# Patient Record
Sex: Male | Born: 1975 | Race: Black or African American | Hispanic: No | Marital: Single | Smoking: Current every day smoker
Health system: Southern US, Community
[De-identification: ages and names within clinical notes are randomized; demographics above are authoritative.]

## PROBLEM LIST (undated history)

## (undated) DIAGNOSIS — I10 Essential (primary) hypertension: Secondary | ICD-10-CM

---

## 1998-09-09 ENCOUNTER — Encounter: Payer: Self-pay | Admitting: Emergency Medicine

## 1998-09-09 ENCOUNTER — Inpatient Hospital Stay (HOSPITAL_COMMUNITY): Admission: EM | Admit: 1998-09-09 | Discharge: 1998-09-10 | Payer: Self-pay | Admitting: Emergency Medicine

## 2000-10-02 ENCOUNTER — Emergency Department (HOSPITAL_COMMUNITY): Admission: EM | Admit: 2000-10-02 | Discharge: 2000-10-02 | Payer: Self-pay | Admitting: Emergency Medicine

## 2001-01-30 ENCOUNTER — Emergency Department (HOSPITAL_COMMUNITY): Admission: EM | Admit: 2001-01-30 | Discharge: 2001-01-30 | Payer: Self-pay | Admitting: Internal Medicine

## 2003-04-03 ENCOUNTER — Emergency Department (HOSPITAL_COMMUNITY): Admission: EM | Admit: 2003-04-03 | Discharge: 2003-04-03 | Payer: Self-pay | Admitting: Emergency Medicine

## 2003-08-24 ENCOUNTER — Emergency Department (HOSPITAL_COMMUNITY): Admission: EM | Admit: 2003-08-24 | Discharge: 2003-08-24 | Payer: Self-pay | Admitting: Emergency Medicine

## 2003-10-22 ENCOUNTER — Encounter: Admission: RE | Admit: 2003-10-22 | Discharge: 2003-10-22 | Payer: Self-pay | Admitting: Internal Medicine

## 2003-11-09 ENCOUNTER — Encounter: Admission: RE | Admit: 2003-11-09 | Discharge: 2003-11-09 | Payer: Self-pay | Admitting: Internal Medicine

## 2004-02-05 ENCOUNTER — Encounter: Admission: RE | Admit: 2004-02-05 | Discharge: 2004-02-05 | Payer: Self-pay | Admitting: Internal Medicine

## 2004-02-26 ENCOUNTER — Encounter: Admission: RE | Admit: 2004-02-26 | Discharge: 2004-02-26 | Payer: Self-pay | Admitting: Internal Medicine

## 2004-07-30 ENCOUNTER — Emergency Department (HOSPITAL_COMMUNITY): Admission: EM | Admit: 2004-07-30 | Discharge: 2004-07-30 | Payer: Self-pay | Admitting: Emergency Medicine

## 2007-02-26 ENCOUNTER — Emergency Department (HOSPITAL_COMMUNITY): Admission: EM | Admit: 2007-02-26 | Discharge: 2007-02-26 | Payer: Self-pay | Admitting: Emergency Medicine

## 2007-03-03 ENCOUNTER — Emergency Department (HOSPITAL_COMMUNITY): Admission: EM | Admit: 2007-03-03 | Discharge: 2007-03-03 | Payer: Self-pay | Admitting: Emergency Medicine

## 2007-12-07 ENCOUNTER — Emergency Department (HOSPITAL_COMMUNITY): Admission: EM | Admit: 2007-12-07 | Discharge: 2007-12-07 | Payer: Self-pay | Admitting: Emergency Medicine

## 2007-12-21 ENCOUNTER — Emergency Department (HOSPITAL_COMMUNITY): Admission: EM | Admit: 2007-12-21 | Discharge: 2007-12-21 | Payer: Self-pay | Admitting: Family Medicine

## 2008-03-12 ENCOUNTER — Emergency Department (HOSPITAL_COMMUNITY): Admission: EM | Admit: 2008-03-12 | Discharge: 2008-03-12 | Payer: Self-pay | Admitting: Emergency Medicine
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
## 2012-08-04 ENCOUNTER — Emergency Department (HOSPITAL_COMMUNITY)
Admission: EM | Admit: 2012-08-04 | Discharge: 2012-08-04 | Disposition: A | Discharge status: Home / Self Care | Payer: Self-pay | Attending: Emergency Medicine | Admitting: Emergency Medicine

## 2012-08-04 ENCOUNTER — Encounter (HOSPITAL_COMMUNITY): Payer: Self-pay | Admitting: Emergency Medicine

## 2012-08-04 DIAGNOSIS — Z0489 Encounter for examination and observation for other specified reasons: Secondary | ICD-10-CM | POA: Insufficient documentation

## 2012-08-04 HISTORY — DX: Essential (primary) hypertension: I10

## 2012-08-04 LAB — HIV ANTIBODY (ROUTINE TESTING W REFLEX): HIV: NONREACTIVE
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
## 2012-08-04 NOTE — ED Notes (Signed)
Patient has been discharged after lab draws.

## 2012-08-05 LAB — HEPATITIS PANEL, ACUTE: Hepatitis B Surface Ag: NEGATIVE

## 2012-10-13 ENCOUNTER — Emergency Department (HOSPITAL_COMMUNITY)
Admission: EM | Admit: 2012-10-13 | Discharge: 2012-10-13 | Disposition: A | Discharge status: Home / Self Care | Payer: Self-pay | Attending: Emergency Medicine | Admitting: Emergency Medicine

## 2012-10-13 ENCOUNTER — Encounter (HOSPITAL_COMMUNITY): Payer: Self-pay | Admitting: Emergency Medicine

## 2012-10-13 DIAGNOSIS — F17200 Nicotine dependence, unspecified, uncomplicated: Secondary | ICD-10-CM | POA: Insufficient documentation

## 2012-10-13 DIAGNOSIS — R110 Nausea: Secondary | ICD-10-CM | POA: Insufficient documentation

## 2012-10-13 DIAGNOSIS — I10 Essential (primary) hypertension: Secondary | ICD-10-CM | POA: Insufficient documentation

## 2012-10-13 DIAGNOSIS — R197 Diarrhea, unspecified: Secondary | ICD-10-CM | POA: Insufficient documentation

## 2012-10-13 MED ORDER — ONDANSETRON 4 MG PO TBDP
4.0000 mg | ORAL_TABLET | Freq: Once | ORAL | Status: AC
  Administered 2012-10-13: 4 mg via ORAL
  Filled 2012-10-13: qty 1

## 2012-10-13 MED ORDER — LOPERAMIDE HCL 2 MG PO CAPS: 2.0000 mg | ORAL_CAPSULE | Freq: Four times a day (QID) | ORAL | Status: DC | PRN

## 2012-10-13 MED ORDER — HYDROCHLOROTHIAZIDE 12.5 MG PO TABS: 12.5000 mg | ORAL_TABLET | Freq: Every day | ORAL | Status: DC

## 2012-10-13 MED ORDER — LOPERAMIDE HCL 2 MG PO CAPS
4.0000 mg | ORAL_CAPSULE | Freq: Once | ORAL | Status: AC
  Administered 2012-10-13: 4 mg via ORAL
  Filled 2012-10-13: qty 2

## 2012-10-13 MED ORDER — GI COCKTAIL ~~LOC~~
30.0000 mL | Freq: Once | ORAL | Status: AC
  Administered 2012-10-13: 30 mL via ORAL
  Filled 2012-10-13: qty 30

## 2012-10-13 MED ORDER — POLYETHYLENE GLYCOL 3350 17 G PO PACK: 17.0000 g | PACK | Freq: Every day | ORAL | Status: DC

## 2012-10-13 NOTE — ED Provider Notes (Signed)
History     CSN: [id number]  Arrival date & time 10/13/12  1308   First MD Initiated Contact with Patient 10/13/12 0701      Chief Complaint  Patient presents with  . Hypertension    (Consider location/radiation/quality/duration/timing/severity/associated sxs/prior treatment) HPI Comments: Patient presents with complaint of high blood pressure. States that he was being treated for HTN while incarcerated and remembers taking norvasc and a "water pill". He presents because he wants to be "checked out:. Patient states that he has also been having nausea and 2 episodes of diarrhea. Denies fever or chills. Denies vomiting or abdominal pain. Denies CP, palpitations, or SOB, or leg swelling.  The history is provided by the patient. No language interpreter was used.    Past Medical History  Diagnosis Date  . Hypertension     History reviewed. No pertinent past surgical history.  No family history on file.  History  Substance Use Topics  . Smoking status: Current Every Day Smoker -- 0.5 packs/day    Types: Cigarettes  . Smokeless tobacco: Not on file  . Alcohol Use: Yes      Review of Systems  Constitutional: Negative for fever and chills.  Respiratory: Negative for shortness of breath.   Cardiovascular: Negative for chest pain, palpitations and leg swelling.  Gastrointestinal: Positive for nausea and diarrhea. Negative for vomiting and abdominal pain.    Allergies  Review of patient's allergies indicates no known allergies.  Home Medications  No current outpatient prescriptions on file.  BP 162/99  Pulse 87  Temp 97.9 F (36.6 C) (Oral)  Resp 17  SpO2 93%  Physical Exam  Nursing note and vitals reviewed. Constitutional: He appears well-developed and well-nourished.  HENT:  Head: Normocephalic and atraumatic.  Mouth/Throat: Oropharynx is clear and moist.  Eyes: Conjunctivae normal and EOM are normal. No scleral icterus.  Neck: Normal range of motion. Neck  supple.  Cardiovascular: Normal rate, regular rhythm, normal heart sounds and intact distal pulses.        No pedal edema appreciated.  Pulmonary/Chest: Effort normal and breath sounds normal.  Abdominal: Soft. Bowel sounds are normal. There is no tenderness.  Neurological: He is alert.  Skin: Skin is warm and dry.    ED Course  Procedures (including critical care time)  Labs Reviewed - No data to display No results found.   1. Hypertension       MDM  Patient presented with concerns for HTN. Patient also complained of gastroenteritis symptoms. Given GI cocktail and loperamide with improvement. Discharged with Rx for prior dosage of HCTZ and phone number for primary care follow-up.  Return precautions given. No red flags for hypertensive urgency/emergency.        Pixie Casino, PA-C 10/13/12 [phone]

## 2012-10-13 NOTE — ED Notes (Signed)
PT. REPORTS HYPERTENSION , DOES NOT TAKE MEDICATIONS , STATES " FELT STRANGE / CAN'T SLEEP "  LAST NIGHT , " JUST WANT TO BE CHECK".

## 2012-10-13 NOTE — ED Notes (Signed)
NAD noted at time of d/c home 

## 2012-10-14 NOTE — ED Provider Notes (Signed)
Medical screening examination/treatment/procedure(s) were performed by non-physician practitioner and as supervising physician I was immediately available for consultation/collaboration.  Marwan T Powers, MD 10/14/12 1648 

## 2012-11-02 ENCOUNTER — Encounter (HOSPITAL_COMMUNITY): Payer: Self-pay

## 2012-11-02 ENCOUNTER — Emergency Department (HOSPITAL_COMMUNITY)
Admission: EM | Admit: 2012-11-02 | Discharge: 2012-11-02 | Disposition: A | Discharge status: Home / Self Care | Payer: Self-pay | Source: Home / Self Care

## 2012-11-02 DIAGNOSIS — F438 Other reactions to severe stress: Secondary | ICD-10-CM

## 2012-11-02 DIAGNOSIS — F17200 Nicotine dependence, unspecified, uncomplicated: Secondary | ICD-10-CM

## 2012-11-02 DIAGNOSIS — F430 Acute stress reaction: Secondary | ICD-10-CM

## 2012-11-02 DIAGNOSIS — Z720 Tobacco use: Secondary | ICD-10-CM

## 2012-11-02 DIAGNOSIS — I10 Essential (primary) hypertension: Secondary | ICD-10-CM

## 2012-11-02 MED ORDER — HYDROCHLOROTHIAZIDE 12.5 MG PO TABS: 12.5000 mg | ORAL_TABLET | Freq: Every day | ORAL | Status: DC

## 2012-11-02 MED ORDER — ALPRAZOLAM 0.5 MG PO TABS: 0.5000 mg | ORAL_TABLET | Freq: Three times a day (TID) | ORAL | Status: DC | PRN

## 2012-11-02 NOTE — ED Provider Notes (Signed)
History     CSN: [id number]  Arrival date & time 11/02/12  1218   None     Chief Complaint  Patient presents with  . Follow-up    (Consider location/radiation/quality/duration/timing/severity/associated sxs/prior treatment) HPI 36 year old male who was recently seen in the clinic 2 weeks ago for uncontrolled hypertension and diarrhea presents with symptoms of increased anxiety and stress. He informs that after his son's accidental death 2 years ago he has been increasingly stressed out and anxious. He also has been very irritable. He denies any headache or blurry vision, dizziness, chest pain, palpitations, shortness of breath, abdominal pain, nausea, vomiting, bowel or urinary symptoms. Denies any weakness. He denies taking his blood pressure medications and she feels very expensive cannot afford it. Denies any suicidal or homicidal ideations.  Past Medical History  Diagnosis Date  . Hypertension     History reviewed. No pertinent past surgical history.  No family history on file.  History  Substance Use Topics  . Smoking status: Current Every Day Smoker -- 0.5 packs/day    Types: Cigarettes  . Smokeless tobacco: Not on file  . Alcohol Use: Yes      Review of Systems As outlined in history of present illness otherwise 10 point review of system unremarkable. Allergies  Review of patient's allergies indicates no known allergies.  Home Medications   Current Outpatient Rx  Name  Route  Sig  Dispense  Refill  . HYDROCHLOROTHIAZIDE 12.5 MG PO TABS   Oral   Take 1 tablet (12.5 mg total) by mouth daily.   30 tablet   1   . LOPERAMIDE HCL 2 MG PO CAPS   Oral   Take 1 capsule (2 mg total) by mouth 4 (four) times daily as needed for diarrhea or loose stools.   30 capsule   0   . POLYETHYLENE GLYCOL 3350 PO PACK   Oral   Take 17 g by mouth daily.   14 each   0     BP 146/90  Pulse 70  Temp 98.5 F (36.9 C) (Oral)  Resp 19  SpO2 100%  Physical  Exam Middle aged male in no acute distress HEENT: No pallor, moist oral mucosa Chest: Clinical condition bilaterally CVS: Normal S1-S2 Abdomen: Soft, nontender CNS: AAO x3 , no tremor ED Course  Procedures (including critical care time)  Labs Reviewed - No data to display No results found.     Assessment/plan Hypertension Uncontrolled vision has not been taking any medications as prescribed. I have counseled him to adhere to his blood pressure medications. Also advised him to go to [facility] for a.4 prescription. Will followup in 2 weeks for monitoring of his blood pressure  Anxiety and increased stress Informs having these symptoms since his son's death 2 years ago and has been persistent . We'll give her prescription for low-dose Xanax for anxiety and stress. Follow up in 2 weeks Tobacco use Patient smokes half to one pack a day and has been strongly counseled on smoking cessation. Does not wish to seeking help at this time  MDM  Follow up in 2 weeks for blood pressure monitoring.        Eddie North, MD 11/02/12 1402

## 2012-11-02 NOTE — ED Notes (Signed)
Follow up recent hospital stay for hypertension

## 2012-12-09 ENCOUNTER — Other Ambulatory Visit (HOSPITAL_COMMUNITY): Payer: Self-pay | Admitting: Internal Medicine

## 2012-12-12 ENCOUNTER — Emergency Department (INDEPENDENT_AMBULATORY_CARE_PROVIDER_SITE_OTHER)
Admission: EM | Admit: 2012-12-12 | Discharge: 2012-12-12 | Disposition: A | Discharge status: Home / Self Care | Payer: Self-pay | Source: Home / Self Care

## 2012-12-12 ENCOUNTER — Encounter (HOSPITAL_COMMUNITY): Payer: Self-pay

## 2012-12-12 DIAGNOSIS — F341 Dysthymic disorder: Secondary | ICD-10-CM

## 2012-12-12 DIAGNOSIS — F419 Anxiety disorder, unspecified: Secondary | ICD-10-CM

## 2012-12-12 DIAGNOSIS — G4700 Insomnia, unspecified: Secondary | ICD-10-CM

## 2012-12-12 DIAGNOSIS — I10 Essential (primary) hypertension: Secondary | ICD-10-CM

## 2012-12-12 MED ORDER — LORAZEPAM 0.5 MG PO TABS: 0.5000 mg | ORAL_TABLET | Freq: Three times a day (TID) | ORAL | Status: DC | PRN

## 2012-12-12 MED ORDER — SERTRALINE HCL 50 MG PO TABS: 50.0000 mg | ORAL_TABLET | Freq: Every day | ORAL | Status: DC

## 2012-12-12 MED ORDER — HYDROCHLOROTHIAZIDE 12.5 MG PO TABS: 12.5000 mg | ORAL_TABLET | Freq: Every day | ORAL | Status: DC

## 2012-12-12 NOTE — ED Provider Notes (Signed)
History     CSN: [id number]  Arrival date & time 12/12/12  1645    Chief Complaint  Patient presents with  . Follow-up   HPI Pt reports that he is still having significant anxiety and depression.  Pt says he lost his son 2 years ago.  The patient has been taking on Xanax which she's been taking for short time.  Patient reports that he does get relief with Xanax.  He still having symptoms.  He's not sleeping well.  He reports that he is still having depression.  He is very irritable.  He reports that he's not suicidal.  He's not having thoughts of hurting others.  He does become agitated at times.  He's currently unemployed and living with family members.  Past Medical History  Diagnosis Date  . Hypertension     History reviewed. No pertinent past surgical history.  No family history on file.  History  Substance Use Topics  . Smoking status: Current Every Day Smoker -- 0.5 packs/day    Types: Cigarettes  . Smokeless tobacco: Not on file  . Alcohol Use: Yes    Review of Systems  Constitutional: Negative.   HENT: Negative.   Genitourinary: Negative.   Neurological: Negative.   Psychiatric/Behavioral: Positive for dysphoric mood and agitation. Negative for hallucinations and confusion.  All other systems reviewed and are negative.    Allergies  Review of patient's allergies indicates no known allergies.  Home Medications   Current Outpatient Rx  Name  Route  Sig  Dispense  Refill  . ALPRAZOLAM 0.5 MG PO TABS   Oral   Take 1 tablet (0.5 mg total) by mouth 3 (three) times daily as needed for anxiety.   30 tablet   0   . HYDROCHLOROTHIAZIDE 12.5 MG PO TABS   Oral   Take 1 tablet (12.5 mg total) by mouth daily.   30 tablet   1   . LOPERAMIDE HCL 2 MG PO CAPS   Oral   Take 1 capsule (2 mg total) by mouth 4 (four) times daily as needed for diarrhea or loose stools.   30 capsule   0   . POLYETHYLENE GLYCOL 3350 PO PACK   Oral   Take 17 g by mouth daily.   14  each   0     BP 145/95  Pulse 67  Temp 98.3 F (36.8 C) (Oral)  Resp 18  SpO2 100%  Physical Exam  Nursing note and vitals reviewed. Constitutional: He is oriented to person, place, and time. He appears well-developed and well-nourished. No distress.  HENT:  Head: Normocephalic and atraumatic.  Eyes: EOM are normal. Pupils are equal, round, and reactive to light.  Neck: Normal range of motion. Neck supple.  Cardiovascular: Normal rate, regular rhythm and normal heart sounds.   Pulmonary/Chest: Effort normal and breath sounds normal.  Abdominal: Soft. Bowel sounds are normal.  Musculoskeletal: Normal range of motion. He exhibits no edema and no tenderness.  Neurological: He is alert and oriented to person, place, and time.  Skin: Skin is warm and dry.  Psychiatric: He has a normal mood and affect. His behavior is normal. Judgment and thought content normal.    ED Course  Procedures (including critical care time)  Labs Reviewed - No data to display No results found.  No diagnosis found.   MDM  IMPRESSION  Anxiety and Depression  Hypertension, uncontrolled   Chronic active nicotine dependence  RECOMMENDATIONS / PLAN Pt contracted for safety.  Trial of zoloft 50 mg po daily.  Lorazepam 0.5 mg po every 4-6 hours prn severe anxiety.   #30 tablets no refills Pt to referred to Behavioral Health  Continue hydrochlorothiazide 12.5 mg by mouth daily.  If no significant improvement then recommend making adjustments to blood pressure regimen. Encouraged tobacco cessation but patient has not open to quitting at this time.  FOLLOW UP 2 weeks   The patient was given clear instructions to go to ER or return to medical center if symptoms don't improve, worsen or new problems develop.  The patient verbalized understanding.  The patient was told to call to get lab results if they haven't heard anything in the next week.            Cleora Fleet, MD 12/12/12 [phone]

## 2012-12-12 NOTE — ED Notes (Signed)
Follow up- hypertension and anxiety

## 2014-05-10 ENCOUNTER — Emergency Department (HOSPITAL_COMMUNITY)
Admission: EM | Admit: 2014-05-10 | Discharge: 2014-05-10 | Disposition: A | Discharge status: Home / Self Care | Payer: Self-pay | Source: Home / Self Care

## 2014-05-11 ENCOUNTER — Emergency Department (INDEPENDENT_AMBULATORY_CARE_PROVIDER_SITE_OTHER)
Admission: EM | Admit: 2014-05-11 | Discharge: 2014-05-11 | Disposition: A | Discharge status: Home / Self Care | Payer: Self-pay | Source: Home / Self Care | Attending: Family Medicine | Admitting: Family Medicine

## 2014-05-11 ENCOUNTER — Encounter (HOSPITAL_COMMUNITY): Payer: Self-pay | Admitting: Emergency Medicine

## 2014-05-11 DIAGNOSIS — L0201 Cutaneous abscess of face: Secondary | ICD-10-CM

## 2014-05-11 DIAGNOSIS — L03211 Cellulitis of face: Secondary | ICD-10-CM

## 2014-05-11 MED ORDER — SULFAMETHOXAZOLE-TRIMETHOPRIM 800-160 MG PO TABS: 2.0000 | ORAL_TABLET | Freq: Two times a day (BID) | ORAL | Status: DC

## 2014-05-11 MED ORDER — DICLOFENAC SODIUM 50 MG PO TBEC: 50.0000 mg | DELAYED_RELEASE_TABLET | Freq: Two times a day (BID) | ORAL | Status: DC | PRN

## 2014-05-11 NOTE — ED Notes (Signed)
Reports abscess to left side of face x 2 days.  Mild drainage last night.  Pain and swelling.  Mild swelling around neck.  Denies any trouble swallowing or sob.

## 2014-05-11 NOTE — ED Provider Notes (Signed)
Steve Jordan is a 38 y.o. male who presents to Urgent Care today for left facial abscess present for 3 days. Patient was able to express a small amount of pus yesterday. He notes tenderness and swelling. He denies any fevers or chills. He has not tried any medications. He feels well otherwise. No nausea vomiting or diarrhea. He does not currently take any medication.   Past Medical History  Diagnosis Date  . Hypertension    History  Substance Use Topics  . Smoking status: Current Every Day Smoker -- 0.50 packs/day    Types: Cigarettes  . Smokeless tobacco: Not on file  . Alcohol Use: Yes   ROS as above Medications: No current facility-administered medications for this encounter.   Current Outpatient Prescriptions  Medication Sig Dispense Refill  . diclofenac (VOLTAREN) 50 MG EC tablet Take 1 tablet (50 mg total) by mouth 2 (two) times daily as needed.  30 tablet  0  . sulfamethoxazole-trimethoprim (SEPTRA DS) 800-160 MG per tablet Take 2 tablets by mouth 2 (two) times daily.  28 tablet  0    Exam:  BP 160/83  Pulse 81  Temp(Src) 99 F (37.2 C) (Oral)  Resp 18  SpO2 99% Gen: Well NAD HEENT: EOMI,  MMM normal posterior pharynx. No lymphadenopathy in the anterior cervical chain Lungs: Normal work of breathing. CTABL Heart: RRR no MRG Abd: NABS, Soft. NT, ND Exts: Brisk capillary refill, warm and well perfused.  Skin: Left skin overlying the mandible. Quarter size area of induration erythema and tenderness with central fluctuance. Small amount of pus expressible. No extending erythema or induration.  Abscess incision and drainage:  Consent obtained and timeout. Skin cleaned with alcohol cold spray applied and 1/2 mL of lidocaine with epinephrine were injected into the skin overlying the area of fluctuance.  Skin was again cleaned with alcohol and 11 blade scalpel was used to cut to the area of fluctuance. Pus was drained and the incision was widened and turned into a  triangle. Blunt dissection was used to break up loculations and further amount of pus was expressed. Wound packed with 1 inch of 1/4  inch packing material. Patient tolerated the procedure well  No results found for this or any previous visit (from the past 24 hour(s)). No results found.  Assessment and Plan: 38 y.o. male with left facial abscess status post incision and drainage. Will additionally treat with Bactrim and diclofenac. Followup as needed  Discussed warning signs or symptoms. Please see discharge instructions. Patient expresses understanding.    Rodolph BongEvan S Corey, MD 05/11/14 [phone]

## 2014-05-11 NOTE — Discharge Instructions (Signed)
Thank you for coming in today. Remove the packing in 2-3 days. It is ok if it falls out sooner.  Complete the antibiotics.   Abscess Care After An abscess (also called a boil or furuncle) is an infected area that contains a collection of pus. Signs and symptoms of an abscess include pain, tenderness, redness, or hardness, or you may feel a moveable soft area under your skin. An abscess can occur anywhere in the body. The infection may spread to surrounding tissues causing cellulitis. A cut (incision) by the surgeon was made over your abscess and the pus was drained out. Gauze may have been packed into the space to provide a drain that will allow the cavity to heal from the inside outwards. The boil may be painful for 5 to 7 days. Most people with a boil do not have high fevers. Your abscess, if seen early, may not have localized, and may not have been lanced. If not, another appointment may be required for this if it does not get better on its own or with medications. HOME CARE INSTRUCTIONS   Only take over-the-counter or prescription medicines for pain, discomfort, or fever as directed by your caregiver.  When you bathe, soak and then remove gauze or iodoform packs at least daily or as directed by your caregiver. You may then wash the wound gently with mild soapy water. Repack with gauze or do as your caregiver directs. SEEK IMMEDIATE MEDICAL CARE IF:   You develop increased pain, swelling, redness, drainage, or bleeding in the wound site.  You develop signs of generalized infection including muscle aches, chills, fever, or a general ill feeling.  An oral temperature above 102 F (38.9 C) develops, not controlled by medication. See your caregiver for a recheck if you develop any of the symptoms described above. If medications (antibiotics) were prescribed, take them as directed. Document Released: 05/28/2005 Document Revised: 02/01/2012 Document Reviewed: 01/23/2008 [facility] Patient  Information 2015 [place], [place]. This information is not intended to replace advice given to you by your health care provider. Make sure you discuss any questions you have with your health care provider.

## 2020-04-07 ENCOUNTER — Ambulatory Visit (HOSPITAL_COMMUNITY)
Admission: EM | Admit: 2020-04-07 | Discharge: 2020-04-07 | Disposition: A | Discharge status: Home / Self Care | Payer: BC Managed Care – PPO | Attending: Family Medicine | Admitting: Family Medicine

## 2020-04-07 ENCOUNTER — Encounter (HOSPITAL_COMMUNITY): Payer: Self-pay

## 2020-04-07 ENCOUNTER — Other Ambulatory Visit: Payer: Self-pay

## 2020-04-07 DIAGNOSIS — L0201 Cutaneous abscess of face: Secondary | ICD-10-CM | POA: Diagnosis not present

## 2020-04-07 MED ORDER — IBUPROFEN 800 MG PO TABS: 800.0000 mg | ORAL_TABLET | Freq: Three times a day (TID) | ORAL | 0 refills | Status: DC | PRN

## 2020-04-07 MED ORDER — DOXYCYCLINE HYCLATE 100 MG PO CAPS: 100.0000 mg | ORAL_CAPSULE | Freq: Two times a day (BID) | ORAL | 0 refills | Status: DC

## 2020-04-07 NOTE — Discharge Instructions (Addendum)
Apply warm compresses 3-4 x daily for 10-15 minutes.  You may then wash site daily with warm water and mild soap  Keep covered to avoid friction  Take antibiotic as prescribed and to completion  Return sooner or go to the ED if you have any new or worsening symptoms such as increased redness, swelling, pain, nausea, vomiting, fever, chills.

## 2020-04-07 NOTE — ED Provider Notes (Signed)
[facility]   [id number] 04/07/20 Arrival Time: 1359  CC: RASH  SUBJECTIVE:  Steve Jordan is a 43 y.o. male who presents with a skin complaint that began on 03/31/20.  Denies precipitating event or trauma.  Denies changes in soaps, detergents, close contacts with similar rash, known trigger or environmental trigger, allergy. Denies medications change or starting a new medication recently. Localizes the rash to L cheek.  Describes it as painful and spreading. He has tried opening the wound on his own with little success. Denies similar symptoms in the past that improved with I & D.   Denies fever, chills, nausea, vomiting, erythema, swelling, discharge, oral lesions, SOB, chest pain, abdominal pain, changes in bowel or bladder function.    ROS: As per HPI.  All other pertinent ROS negative.     Past Medical History:  Diagnosis Date  . Hypertension    History reviewed. No pertinent surgical history. No Known Allergies No current facility-administered medications on file prior to encounter.   Current Outpatient Medications on File Prior to Encounter  Medication Sig Dispense Refill  . diclofenac (VOLTAREN) 50 MG EC tablet Take 1 tablet (50 mg total) by mouth 2 (two) times daily as needed. 30 tablet 0  . sulfamethoxazole-trimethoprim (SEPTRA DS) 800-160 MG per tablet Take 2 tablets by mouth 2 (two) times daily. 28 tablet 0   Social History   Socioeconomic History  . Marital status: Single    Spouse name: Not on file  . Number of children: Not on file  . Years of education: Not on file  . Highest education level: Not on file  Occupational History  . Not on file  Tobacco Use  . Smoking status: Current Every Day Smoker    Packs/day: 0.50    Types: Cigarettes  Substance and Sexual Activity  . Alcohol use: Yes  . Drug use: Yes    Types: Marijuana  . Sexual activity: Not on file  Other Topics Concern  . Not on file  Social History Narrative  . Not on file   Social  Determinants of Health   Financial Resource Strain:   . Difficulty of Paying Living Expenses:   Food Insecurity:   . Worried About Programme researcher, broadcasting/film/video in the Last Year:   . Barista in the Last Year:   Transportation Needs:   . Freight forwarder (Medical):   Marland Kitchen Lack of Transportation (Non-Medical):   Physical Activity:   . Days of Exercise per Week:   . Minutes of Exercise per Session:   Stress:   . Feeling of Stress :   Social Connections:   . Frequency of Communication with Friends and Family:   . Frequency of Social Gatherings with Friends and Family:   . Attends Religious Services:   . Active Member of Clubs or Organizations:   . Attends Banker Meetings:   Marland Kitchen Marital Status:   Intimate Partner Violence:   . Fear of Current or Ex-Partner:   . Emotionally Abused:   Marland Kitchen Physically Abused:   . Sexually Abused:    History reviewed. No pertinent family history.  OBJECTIVE: Vitals:   04/07/20 1427  BP: (!) 155/86  Pulse: 73  Resp: 18  Temp: 98.3 F (36.8 C)  TempSrc: Oral  SpO2: 100%    General appearance: alert; no distress Head: NCAT Lungs: clear to auscultation bilaterally Heart: regular rate and rhythm.  Radial pulse 2+ bilaterally Extremities: no edema Skin: warm and dry; area  of swelling and TTP about 2 x 2 cm in diameter to L cheek just beside the mouth. No swelling or erythema noted to orbits, orbits are nontender, and no edema noted. Psychological: alert and cooperative; normal mood and affect  ASSESSMENT & PLAN:  1. Cutaneous abscess of face     Meds ordered this encounter  Medications  . ibuprofen (ADVIL) 800 MG tablet    Sig: Take 1 tablet (800 mg total) by mouth every 8 (eight) hours as needed for moderate pain.    Dispense:  21 tablet    Refill:  0    Order Specific Question:   Supervising Provider    Answer:   Chase Picket [id number]  . doxycycline (VIBRAMYCIN) 100 MG capsule    Sig: Take 1 capsule (100 mg total) by  mouth 2 (two) times daily.    Dispense:  14 capsule    Refill:  0    Order Specific Question:   Supervising Provider    Answer:   Chase Picket [id number]     Prescribed Doxycycline  Prescribed ibuprofen Apply warm compresses to the area. Take as prescribed and to completion Moisturize skin daily  Follow up with PCP if symptoms persists Return or go to the ER if you have any new or worsening symptoms such as fever, chills, nausea, vomiting, redness, swelling, discharge, if symptoms do not improve with medications. Reviewed expectations re: course of current medical issues. Questions answered. Outlined signs and symptoms indicating need for more acute intervention. Patient verbalized understanding. After Visit Summary given.   Faustino Congress, NP 04/07/20 1459

## 2020-04-07 NOTE — ED Triage Notes (Signed)
Pt present an ingrown hair/Abscess on his jaw. Symptoms started 3 days ago. He would like to get the abscess drained if he can.

## 2020-04-08 ENCOUNTER — Telehealth (HOSPITAL_COMMUNITY): Payer: Self-pay | Admitting: Urgent Care

## 2020-04-08 NOTE — Telephone Encounter (Signed)
Patient called requesting note for work.  He was seen yesterday for facial abscess by NP Ashley Royalty.  Will provide patient with a work note to return to work 04/11/2020.

## 2020-04-26 ENCOUNTER — Other Ambulatory Visit: Payer: Self-pay

## 2020-04-26 ENCOUNTER — Ambulatory Visit (INDEPENDENT_AMBULATORY_CARE_PROVIDER_SITE_OTHER): Payer: BC Managed Care – PPO | Admitting: Licensed Clinical Social Worker

## 2020-04-26 DIAGNOSIS — F332 Major depressive disorder, recurrent severe without psychotic features: Secondary | ICD-10-CM | POA: Diagnosis not present

## 2020-04-29 ENCOUNTER — Telehealth (INDEPENDENT_AMBULATORY_CARE_PROVIDER_SITE_OTHER): Payer: BC Managed Care – PPO | Admitting: Psychiatry

## 2020-04-29 ENCOUNTER — Other Ambulatory Visit: Payer: Self-pay

## 2020-04-29 ENCOUNTER — Encounter (HOSPITAL_COMMUNITY): Payer: Self-pay | Admitting: Psychiatry

## 2020-04-29 DIAGNOSIS — F332 Major depressive disorder, recurrent severe without psychotic features: Secondary | ICD-10-CM

## 2020-04-29 MED ORDER — PRAZOSIN HCL 1 MG PO CAPS: 1.0000 mg | ORAL_CAPSULE | Freq: Every day | ORAL | 1 refills | Status: DC

## 2020-04-29 MED ORDER — UNISOM SLEEPTABS 25 MG PO TABS: 25.0000 mg | ORAL_TABLET | Freq: Every evening | ORAL | 0 refills | Status: DC | PRN

## 2020-04-29 MED ORDER — OLANZAPINE 5 MG PO TABS: 5.0000 mg | ORAL_TABLET | Freq: Every day | ORAL | 0 refills | Status: AC

## 2020-04-29 MED ORDER — CITALOPRAM HYDROBROMIDE 20 MG PO TABS: 20.0000 mg | ORAL_TABLET | Freq: Every day | ORAL | 1 refills | Status: DC

## 2020-04-29 NOTE — Progress Notes (Signed)
Psychiatric Initial Adult Assessment Virtual Visit via Video Note  I connected with Steve Jordan on 04/29/20 at  1:00 PM EDT by a video enabled telemedicine application and verified that I am speaking with the correct person using two identifiers.  Location: Patient: Home Provider: Clinic   I discussed the limitations of evaluation and management by telemedicine and the availability of in person appointments. The patient expressed understanding and agreed to proceed.  I provided 45 minutes of non-face-to-face time during this encounter.     Patient Identification: Steve Jordan MRN:  [id number] Date of Evaluation:  04/29/2020 Referral Source: "I have been depressed and in need of more medications" Chief Complaint:   Visit Diagnosis:    ICD-10-CM   1. Severe episode of recurrent major depressive disorder, without psychotic features (HCC)  F33.2 citalopram (CELEXA) 20 MG tablet    OLANZapine (ZYPREXA) 5 MG tablet    prazosin (MINIPRESS) 1 MG capsule    doxylamine, Sleep, (UNISOM SLEEPTABS) 25 MG tablet    History of Present Illness: 44 year old male seen today for initial psychiatric evaluation.  Patient was referred to outpatient psychiatry by [facility] for medication management.  He has a psychiatric history of anxiety and depression.  He is currently prescribed Zyprexa 5 mg daily, Minipress 1 mg at bedtime, and Celexa 20 mg daily.  He notes that his medications have been effective in the past in managing his depression however notes that he needs refills.  Today he reports that he has been feeling depressed and endorsed insomnia, difficulty concentrating, impaired memory, and weight gain.  Patient requests medications to help him sleep.  Provider offered patient Unisom 25 mg at bedtime.  He endorsed understanding and is agreeable to try medication.  He reports at times he is irritable, impulsive, and have racing thoughts.  He endorses auditory hallucination stating that he hears noises  that he knows are not there.  He denies visual hallucinations.  He reports that his son was electrocuted and passed away in 03/08/10.  He notes that he thinks about his son often and is still saddened by his son's death.    The patient reports that he saw an outpatient counselor today for threapy.  He notes that he was irritable during the visit because he dislikes talking.  He however notes that he will follow-up with therapy and is agreeable to continue medications as prescribed.  No other concerns noted at this time Associated Signs/Symptoms: Depression Symptoms:  depressed mood, insomnia, difficulty concentrating, impaired memory, weight gain, (Hypo) Manic Symptoms:  Flight of Ideas, Hallucinations, Impulsivity, Irritable Mood, Anxiety Symptoms:  Denies Psychotic Symptoms:  Hallucinations: Auditory PTSD Symptoms: Had a traumatic exposure:  Patient notes that his was electrocuted in 2010-03-08 and has recurrent thoughts of his sons.  Past Psychiatric History: Depression and anxiety  Previous Psychotropic Medications: Yes   Substance Abuse History in the last 12 months:  Yes.    Consequences of Substance Abuse: NA Denies  Past Medical History:  Past Medical History:  Diagnosis Date  . Hypertension    History reviewed. No pertinent surgical history.  Family Psychiatric History: Denies family psychiatric history  Family History: History reviewed. No pertinent family history.  Social History:   Social History   Socioeconomic History  . Marital status: Single    Spouse name: Not on file  . Number of children: Not on file  . Years of education: Not on file  . Highest education level: Not on file  Occupational History  . Not on  file  Tobacco Use  . Smoking status: Current Every Day Smoker    Packs/day: 0.50    Types: Cigarettes  Substance and Sexual Activity  . Alcohol use: Yes  . Drug use: Yes    Types: Marijuana  . Sexual activity: Not on file  Other Topics Concern  .  Not on file  Social History Narrative  . Not on file   Social Determinants of Health   Financial Resource Strain:   . Difficulty of Paying Living Expenses:   Food Insecurity:   . Worried About Charity fundraiser in the Last Year:   . Arboriculturist in the Last Year:   Transportation Needs:   . Film/video editor (Medical):   Marland Kitchen Lack of Transportation (Non-Medical):   Physical Activity:   . Days of Exercise per Week:   . Minutes of Exercise per Session:   Stress:   . Feeling of Stress :   Social Connections:   . Frequency of Communication with Friends and Family:   . Frequency of Social Gatherings with Friends and Family:   . Attends Religious Services:   . Active Member of Clubs or Organizations:   . Attends Archivist Meetings:   Marland Kitchen Marital Status:     Additional Social History: Patient reports that he lives in [place].  He notes that he has 1 daughter and a son who is now deceased.  He endorses marijuana use reporting that it helps with his anxiety.  Reports that he drinks alcohol on occasions.  Allergies:  No Known Allergies  Metabolic Disorder Labs: No results found for: HGBA1C, MPG No results found for: PROLACTIN No results found for: CHOL, TRIG, HDL, CHOLHDL, VLDL, LDLCALC No results found for: TSH  Therapeutic Level Labs: No results found for: LITHIUM No results found for: CBMZ No results found for: VALPROATE  Current Medications: Current Outpatient Medications  Medication Sig Dispense Refill  . citalopram (CELEXA) 20 MG tablet Take 1 tablet (20 mg total) by mouth daily. 20 tablet 1  . OLANZapine (ZYPREXA) 5 MG tablet Take 1 tablet (5 mg total) by mouth at bedtime. 30 tablet 0  . prazosin (MINIPRESS) 1 MG capsule Take 1 capsule (1 mg total) by mouth at bedtime. 30 capsule 1  . diclofenac (VOLTAREN) 50 MG EC tablet Take 1 tablet (50 mg total) by mouth 2 (two) times daily as needed. 30 tablet 0  . doxycycline (VIBRAMYCIN) 100 MG capsule Take  1 capsule (100 mg total) by mouth 2 (two) times daily. 14 capsule 0  . doxylamine, Sleep, (UNISOM SLEEPTABS) 25 MG tablet Take 1 tablet (25 mg total) by mouth at bedtime as needed. 30 tablet 0  . ibuprofen (ADVIL) 800 MG tablet Take 1 tablet (800 mg total) by mouth every 8 (eight) hours as needed for moderate pain. 21 tablet 0  . sulfamethoxazole-trimethoprim (SEPTRA DS) 800-160 MG per tablet Take 2 tablets by mouth 2 (two) times daily. 28 tablet 0   No current facility-administered medications for this visit.    Musculoskeletal: Strength & Muscle Tone: Unable to assess due to telehealth visit.  Gait & Station: Unable to assess due to telehealth visit.  Patient leans: Right  Psychiatric Specialty Exam: Review of Systems  There were no vitals taken for this visit.There is no height or weight on file to calculate BMI.  General Appearance: Well Groomed  Eye Contact:  Good  Speech:  Clear and Coherent and Normal Rate  Volume:  Normal  Mood:  Depressed  Affect:  Congruent  Thought Process:  Coherent, Goal Directed and Linear  Orientation:  Full (Time, Place, and Person)  Thought Content:  Hallucinations: Auditory  Suicidal Thoughts:  No  Homicidal Thoughts:  No  Memory:  Immediate;   Good Recent;   Good Remote;   Good  Judgement:  Good  Insight:  Good  Psychomotor Activity:  Normal  Concentration:  Concentration: Good and Attention Span: Good  Recall:  Good  Fund of Knowledge:Good  Language: Good  Akathisia:  No  Handed:  Right  AIMS (if indicated):Not done  Assets:  Communication Skills Desire for Improvement Housing Social Support  ADL's:  Intact  Cognition: WNL  Sleep:  Poor   Screenings:   Assessment and Plan: Patient reports that his medication has been effective in managing his depression.  At this time he does not require medication adjustment however requested medication refills.  Patient notes that he has had difficulty fatigue sleeping.  Patient is agreeable to  starting Unisom 25 mg at bedtime.  He is agreeable to continue all other medications as prescribed 1. Severe episode of recurrent major depressive disorder, without psychotic features (HCC)  Continue- citalopram (CELEXA) 20 MG tablet; Take 1 tablet (20 mg total) by mouth daily.  Dispense: 20 tablet; Refill: 1 Continue- OLANZapine (ZYPREXA) 5 MG tablet; Take 1 tablet (5 mg total) by mouth at bedtime.  Dispense: 30 tablet; Refill: 0 Continue- prazosin (MINIPRESS) 1 MG capsule; Take 1 capsule (1 mg total) by mouth at bedtime.  Dispense: 30 capsule; Refill: 1 Start- doxylamine, Sleep, (UNISOM SLEEPTABS) 25 MG tablet; Take 1 tablet (25 mg total) by mouth at bedtime as needed.  Dispense: 30 tablet; Refill: 0    Shanna Cisco, NP 6/7/20214:52 PM

## 2020-04-29 NOTE — Progress Notes (Signed)
Comprehensive Clinical Assessment (CCA) Note  04/29/2020 Steve Jordan [id number]  Visit Diagnosis:      ICD-10-CM   1. Severe episode of recurrent major depressive disorder, without psychotic features (HCC)  F33.2     CCA Screening, Triage and Referral (STR) Patient Reported Information How did you hear about [place]? Other (Comment) Vesta Mixer)  Referral name: Vesta Mixer  What Is the Reason for Your Visit/Call Today? Establish care for depression  How Long Has This Been Causing You Problems? > than 6 months  What Do You Feel Would Help You the Most Today? Therapy;Medication  Have You Recently Been in Any Inpatient Treatment (Hospital/Detox/Crisis Center/28-Day Program)? No  Have You Recently Had Any Thoughts About Hurting Yourself? No  Are You Planning to Commit Suicide/Harm Yourself At This time? No  Have you Recently Had Thoughts About Hurting Someone Steve Jordan? No  Have You Used Any Alcohol or Drugs in the Past 24 Hours? No  Do You Currently Have a Therapist/Psychiatrist? No  Have You Been Recently Discharged From Any Office Practice or Programs? Yes  Explanation of Discharge From Practice/Program: [facility] transitioning pt care to [facility]   CCA Screening Triage Referral Assessment Type of Contact: Tele-Assessment  Is this Initial or Reassessment? Initial Assessment  Patient Reported Information Reviewed? Yes  Collateral Involvement: None reported  Patient Determined To Be At Risk for Harm To Self or Others Based on Review of Patient Reported Information or Presenting Complaint? No  Location of Assessment: [facility]  Does Patient Present under Involuntary Commitment? No  [place] of Residence: [place]  Patient Currently Receiving the Following Services: Medication Management  Options For Referral: Medication Management;Outpatient Therapy  CCA Biopsychosocial Intake/Chief Complaint:  CCA Intake With Chief Complaint CCA Part Two Date: 04/26/20 CCA Part Two  Time: 0800 Chief Complaint/Presenting Problem: Depression Patient's Currently Reported Symptoms/Problems: Self isolating as much as possible, sleep problems, wt gain, irritable Individual's Strengths: steadily working, emotional support Biomedical engineer Preferences: Pt wishes to avoid discussion of unresolved grief Type of Services Patient Feels Are Needed: Pt accepting of counseling session 1xmon Initial Clinical Notes/Concerns: Pt lost his only son at age 38 in an accidental electrocution. Pt incarcerated at the time but did get to go to funeral service. He has unresolved grief he is resistant to processing.  Mental Health Symptoms Depression:  Depression: Irritability, Sleep (too much or little), Weight gain/loss  Mania:  Mania: None  Anxiety:   Anxiety: None  Psychosis:  Psychosis: None  Trauma:  Trauma: Avoids reminders of event, Detachment from others, Emotional numbing, Irritability/anger  Obsessions:  Obsessions: None  Compulsions:  Compulsions: None  Inattention:  Inattention: None  Hyperactivity/Impulsivity:  Hyperactivity/Impulsivity: N/A  Oppositional/Defiant Behaviors:  Oppositional/Defiant Behaviors: N/A  Emotional Irregularity:  Emotional Irregularity: Chronic feelings of emptiness  Other Mood/Personality Symptoms:      Mental Status Exam Appearance and self-care  Stature:  Stature: Average  Weight:  Weight: Overweight  Clothing:  Clothing: Casual  Grooming:  Grooming: Normal  Cosmetic use:  Cosmetic Use: None  Posture/gait:  Posture/Gait: Normal  Motor activity:  Motor Activity: Not Remarkable  Sensorium  Attention:  Attention: Normal  Concentration:  Concentration: Variable  Orientation:  Orientation: X5  Recall/memory:  Recall/Memory: Normal  Affect and Mood  Affect:  Affect: Depressed, Restricted  Mood:  Mood: Depressed  Relating  Eye contact:  Eye Contact: Avoided  Facial expression:  Facial Expression: Depressed  Attitude toward examiner:  Attitude  Toward Examiner: Guarded  Thought and Language  Speech flow: Speech  Flow: Slow, Soft  Thought content:  Thought Content: Appropriate to Mood and Circumstances  Preoccupation:  Preoccupations: Other (Comment)(Loss of son)  Hallucinations:  Hallucinations: None  Organization:     Transport planner of Knowledge:  Fund of Knowledge: Average  Intelligence:  Intelligence: Average  Abstraction:  Abstraction: Normal  Judgement:  Judgement: Fair  Art therapist:  Reality Testing: Adequate  Insight:  Insight: Fair  Decision Making:  Decision Making: Normal  Social Functioning  Social Maturity:  Social Maturity: Isolates  Social Judgement:  Social Judgement: "Games developer"  Stress  Stressors:  Stressors: Grief/losses  Coping Ability:  Coping Ability: (Limited)  Skill Deficits:  Skill Deficits: (Emotional self care)  Supports:  Supports: Support needed   Religion: Religion/Spirituality Are You A Religious Person?: Yes What is Your Religious Affiliation?: Christian How Might This Affect Treatment?: A strength to draw upon  Leisure/Recreation: Leisure / Recreation Do You Have Hobbies?: Yes Leisure and Hobbies: Insurance underwriter, Riding ATV, Time with dog named Chief Operating Officer who is 76 mon old  Exercise/Diet: Exercise/Diet Do You Exercise?: Yes What Type of Exercise Do You Do?: Other (Comment)(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.) How Many Times a Week Do You Exercise?: 4-5 times a week Have You Gained or Lost A Significant Amount of Weight in the Past Six Months?: Yes-Gained Number of Pounds Gained: (unknown number of lbs, pt states he attributes weight gain to meds and only takes them "here and there".) Do You Follow a Special Diet?: No Do You Have Any Trouble Sleeping?: Yes Explanation of Sleeping Difficulties: inadequate sleep per pt  CCA Employment/Education Employment/Work Situation: Employment / Work Situation Employment situation: Employed Where is  patient currently employed?: [employer] How long has patient been employed?: 9 mon Patient's job has been impacted by current illness: No(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 He states "I do my work and they leave me alone.") Has patient ever been in the [employer]?: No  Education: Education Is Patient Currently Attending School?: No Last Grade Completed: 9 Did You Graduate From [employer]?: No Did You Nutritional therapist?: No Did You Attend Graduate School?: No  CCA Family/Childhood History Family and Relationship History: Family history Marital status: Single What is your sexual orientation?: heterosexual Does patient have children?: Yes How many children?: 2(Son deceased, has a 15 yr old dtr.) How is patient's relationship with their children?: Pt reports his dtr would like to see him more often than he feels he is able d/t self isolation.  Childhood History:  Childhood History By whom was/is the patient raised?: Both parents, Other (Comment)(Parents seperated when pt was 26.) Patient's description of current relationship with people who raised him/her: Distant How were you disciplined when you got in trouble as a child/adolescent?: Spankings, time out Does patient have siblings?: Yes Number of Siblings: 2(one half brother on dad's side and one half sister on mom's side) Description of patient's current relationship with siblings: Distant Did patient suffer any verbal/emotional/physical/sexual abuse as a child?: No Did patient suffer from severe childhood neglect?: No Has patient ever been sexually abused/assaulted/raped as an adolescent or adult?: No Has patient been affected by domestic violence as an adult?: No   CCA Substance Use Alcohol/Drug Use: Alcohol / Drug Use History of alcohol / drug use?: No history of alcohol / drug abuse(Pt reports he is a social drinker, denies drug use)  Steve Jordan

## 2020-05-24 ENCOUNTER — Ambulatory Visit (HOSPITAL_COMMUNITY): Payer: BC Managed Care – PPO | Admitting: Licensed Clinical Social Worker

## 2020-05-24 ENCOUNTER — Other Ambulatory Visit: Payer: Self-pay

## 2020-05-28 ENCOUNTER — Telehealth (HOSPITAL_COMMUNITY): Payer: BC Managed Care – PPO | Admitting: Psychiatry

## 2020-05-28 ENCOUNTER — Other Ambulatory Visit: Payer: Self-pay

## 2020-05-28 ENCOUNTER — Encounter (HOSPITAL_COMMUNITY): Payer: Self-pay | Admitting: Psychiatry

## 2020-06-18 ENCOUNTER — Other Ambulatory Visit: Payer: Self-pay

## 2020-06-18 ENCOUNTER — Ambulatory Visit (INDEPENDENT_AMBULATORY_CARE_PROVIDER_SITE_OTHER): Payer: BC Managed Care – PPO | Admitting: Licensed Clinical Social Worker

## 2020-06-18 DIAGNOSIS — F332 Major depressive disorder, recurrent severe without psychotic features: Secondary | ICD-10-CM

## 2020-06-18 NOTE — Progress Notes (Signed)
   THERAPIST PROGRESS NOTE  Session Time: 30 min  Participation Level: Active  Behavioral Response: CasualDrowsyNegative and Depressed  Type of Therapy: Individual Therapy  Treatment Goals addressed: Communication: Additional assessment and Coping  Interventions: CBT, Supportive and Reframing  Summary: Steve Jordan is a 44 y.o. male who presents with hx of MDD. Today pt signs on with link sent for video session. Pt missed last session. Pt states he has just gotten off work and is sleepy. He is reclined most of session but participatory. Pt states his job at [employer] is "going fine". LCSW assessed for s&s of depression. Pt states everything is about the same, "It's gonna stay the same". He brings up the death of his son and how rare his death was from electrocution. He feels he is a different person since the death and since nothing can bring son back nothing will change for him. He states "I'm doing alright. I really don't like to think about it". He continues to heavily self isolate. LCSW acknowledged pt's thoughts and feelings, provided education on self talk and effects of unmanaged pain/grief. Reframed the fact that the prospect of him improving his quality of life is real if he is willing to participate in the work with a desire for change. Assessed for his willingness to journal and consider letter writing. Pt reports he did some journaling in prison but not since. He states he visits the gravesite at times and talks to his son. LCSW encouraged this and asked him to consider letter writing. Pt noncommittal. LCSW reassessed relationship with his only other child, his dtr and grandson. He reports she sent him a birthday text but they have not talked. He agrees she too could be hurting from the death of her brother. LCSW suggested they might be able to support one another. He shows minimal stated desire to change this dynamic either but appears to be listening to clinician. Shifted to coping  strategies. Pt reports his ATV is in the shop but he hopes to get it today. Assessed for status of dog, Batman. Pt smiles and laughs as he talks about these two things he enjoys. Pt asks med and pharmacy questions. LCSW provided phone # for [place] and [facility]. LCSW facilitated med management appt with schedulers. LCSW provided education on crisis unit at this facility. Reviewed poc with pt's verbal agreement. Pt states appreciation for care and would like to continue video sessions 1xmon.         Suicidal/Homicidal: Nowithout intent/plan  Therapist Response: Pt remains receptive to care.  Plan: Return again in 4 weeks.  Diagnosis: Axis I: Major Depression, Recurrent severe    Axis II: Deferred  [place], LCSW 06/18/2020

## 2020-07-22 ENCOUNTER — Ambulatory Visit (HOSPITAL_COMMUNITY): Payer: BC Managed Care – PPO | Admitting: Licensed Clinical Social Worker

## 2020-07-22 ENCOUNTER — Other Ambulatory Visit: Payer: Self-pay

## 2020-07-22 ENCOUNTER — Telehealth (HOSPITAL_COMMUNITY): Payer: Self-pay | Admitting: Licensed Clinical Social Worker

## 2020-07-22 NOTE — Telephone Encounter (Signed)
LCSW sent text to pt's phone for virtual video session per his preference. Pt signs on and reports he is in the middle of packing to move. He states he would like to reschedule. LCSW agrees. Session will be rescheduled.

## 2020-07-24 ENCOUNTER — Telehealth (HOSPITAL_COMMUNITY): Payer: BC Managed Care – PPO | Admitting: Psychiatry

## 2020-07-24 ENCOUNTER — Other Ambulatory Visit: Payer: Self-pay

## 2020-08-19 ENCOUNTER — Other Ambulatory Visit: Payer: Self-pay

## 2020-08-19 ENCOUNTER — Ambulatory Visit (HOSPITAL_COMMUNITY): Payer: Self-pay | Admitting: Licensed Clinical Social Worker

## 2020-08-19 ENCOUNTER — Telehealth (HOSPITAL_COMMUNITY): Payer: Self-pay | Admitting: Licensed Clinical Social Worker

## 2020-08-19 NOTE — Telephone Encounter (Signed)
LCSW sent link for video session as scheduled. When pt did not sign on for session LCSW phoned pt. Pt answers and states he cannot participate as he is at work. He states he is now working two jobs. LCSW advised pt of next session date and time. Pt states he will be sure to be present.

## 2020-09-16 ENCOUNTER — Ambulatory Visit (HOSPITAL_COMMUNITY): Payer: Self-pay | Admitting: Licensed Clinical Social Worker

## 2020-09-16 ENCOUNTER — Telehealth (HOSPITAL_COMMUNITY): Payer: Self-pay | Admitting: Licensed Clinical Social Worker

## 2020-09-16 ENCOUNTER — Other Ambulatory Visit: Payer: Self-pay

## 2020-09-16 NOTE — Telephone Encounter (Signed)
LCSW sent text with video link for 9a appt. When pt failed to sign on LCSW called pt. The call went to vm. LCSW left vm with details of the purpose of call. Left phone contact for [facility] for pt to call back to learn scheduling options as desired.

## 2020-10-21 ENCOUNTER — Ambulatory Visit (HOSPITAL_COMMUNITY): Payer: Self-pay | Admitting: Licensed Clinical Social Worker

## 2021-05-25 ENCOUNTER — Encounter (HOSPITAL_COMMUNITY): Payer: Self-pay | Admitting: Emergency Medicine

## 2021-05-25 ENCOUNTER — Ambulatory Visit (HOSPITAL_COMMUNITY)
Admission: EM | Admit: 2021-05-25 | Discharge: 2021-05-25 | Disposition: A | Discharge status: Home / Self Care | Payer: BC Managed Care – PPO | Attending: Student | Admitting: Student

## 2021-05-25 DIAGNOSIS — M5412 Radiculopathy, cervical region: Secondary | ICD-10-CM | POA: Diagnosis not present

## 2021-05-25 MED ORDER — PREDNISONE 20 MG PO TABS: 40.0000 mg | ORAL_TABLET | Freq: Every day | ORAL | 0 refills | Status: AC

## 2021-05-25 MED ORDER — TIZANIDINE HCL 2 MG PO TABS: 2.0000 mg | ORAL_TABLET | Freq: Four times a day (QID) | ORAL | 0 refills | Status: DC | PRN

## 2021-05-25 NOTE — ED Triage Notes (Signed)
Pt presents with right arm/ should pain and numbness. States does a lot of lifting and movements at job. States arm goes numb at night.

## 2021-05-25 NOTE — ED Provider Notes (Signed)
[facility]    CSN: [id number] Arrival date & time: 05/25/21  1012      History   Chief Complaint Chief Complaint  Patient presents with   Arm Pain   Shoulder Pain    HPI Steve Jordan is a 45 y.o. male presenting with right arm numbness and tingling x3 days.  Medical history hypertension.  States he does a lot of heavy lifting and repetitive movements at work, but no issues like this in the past. Notes numbness and tingling, worst over upper outer arm. Sensitive to touch over elbow. Denies weakness, shooting pain. Right handed. Denies neck pain, back pain.       HPI  Past Medical History:  Diagnosis Date   Hypertension     Patient Active Problem List   Diagnosis Date Noted   Severe episode of recurrent major depressive disorder, without psychotic features (HCC) 04/29/2020    History reviewed. No pertinent surgical history.     Home Medications    Prior to Admission medications   Medication Sig Start Date End Date Taking? Authorizing Provider  predniSONE (DELTASONE) 20 MG tablet Take 2 tablets (40 mg total) by mouth daily for 5 days. 05/25/21 05/30/21 Yes Rhys Martini, PA-C  tiZANidine (ZANAFLEX) 2 MG tablet Take 1 tablet (2 mg total) by mouth every 6 (six) hours as needed for muscle spasms. 05/25/21  Yes Rhys Martini, PA-C  citalopram (CELEXA) 20 MG tablet Take 1 tablet (20 mg total) by mouth daily. 04/29/20 05/29/20  Shanna Cisco, NP  diclofenac (VOLTAREN) 50 MG EC tablet Take 1 tablet (50 mg total) by mouth 2 (two) times daily as needed. 05/11/14   Rodolph Bong, MD  doxycycline (VIBRAMYCIN) 100 MG capsule Take 1 capsule (100 mg total) by mouth 2 (two) times daily. 04/07/20   Moshe Cipro, NP  doxylamine, Sleep, (UNISOM SLEEPTABS) 25 MG tablet Take 1 tablet (25 mg total) by mouth at bedtime as needed. 04/29/20   Shanna Cisco, NP  ibuprofen (ADVIL) 800 MG tablet Take 1 tablet (800 mg total) by mouth every 8 (eight) hours as needed for  moderate pain. 04/07/20   Moshe Cipro, NP  OLANZapine (ZYPREXA) 5 MG tablet Take 1 tablet (5 mg total) by mouth at bedtime. 04/29/20 05/29/20  Shanna Cisco, NP  prazosin (MINIPRESS) 1 MG capsule Take 1 capsule (1 mg total) by mouth at bedtime. 04/29/20   Shanna Cisco, NP  sulfamethoxazole-trimethoprim (SEPTRA DS) 800-160 MG per tablet Take 2 tablets by mouth 2 (two) times daily. 05/11/14   Rodolph Bong, MD    Family History History reviewed. No pertinent family history.  Social History Social History   Tobacco Use   Smoking status: Every Day    Packs/day: 0.50    Pack years: 0.00    Types: Cigarettes  Substance Use Topics   Alcohol use: Yes   Drug use: Yes    Types: Marijuana     Allergies   Patient has no known allergies.   Review of Systems Review of Systems  Musculoskeletal:        Right arm numbness and tingling  All other systems reviewed and are negative.   Physical Exam Triage Vital Signs ED Triage Vitals  Enc Vitals Group     BP 05/25/21 1102 (!) 161/83     Pulse Rate 05/25/21 1102 80     Resp 05/25/21 1102 18     Temp 05/25/21 1102 98.2 F (36.8 C)  Temp Source 05/25/21 1102 Oral     SpO2 05/25/21 1102 99 %     Weight --      Height --      Head Circumference --      Peak Flow --      Pain Score 05/25/21 1100 6     Pain Loc --      Pain Edu? --      Excl. in GC? --    No data found.  Updated Vital Signs BP (!) 161/83 (BP Location: Right Arm)   Pulse 80   Temp 98.2 F (36.8 C) (Oral)   Resp 18   SpO2 99%   Visual Acuity Right Eye Distance:   Left Eye Distance:   Bilateral Distance:    Right Eye Near:   Left Eye Near:    Bilateral Near:     Physical Exam Vitals reviewed.  Constitutional:      General: He is not in acute distress.    Appearance: Normal appearance. He is not ill-appearing or diaphoretic.  HENT:     Head: Normocephalic and atraumatic.  Cardiovascular:     Rate and Rhythm: Normal rate and regular  rhythm.     Heart sounds: Normal heart sounds.  Pulmonary:     Effort: Pulmonary effort is normal.     Breath sounds: Normal breath sounds.  Musculoskeletal:     Comments: R arm/shoulder without reproducible tenderness. Strength 5/5, sensation intact. Abduction and adduction intact and without pain. Negative empty beer can and neer. Grip strength 5/5, radial pulse 2+, cap refill <2 seconds. Negative spurling. No cervical spinous or paraspinous tenderness. No trapezius tenderness.  Skin:    General: Skin is warm.  Neurological:     General: No focal deficit present.     Mental Status: He is alert and oriented to person, place, and time.  Psychiatric:        Mood and Affect: Mood normal.        Behavior: Behavior normal.        Thought Content: Thought content normal.        Judgment: Judgment normal.     UC Treatments / Results  Labs (all labs ordered are listed, but only abnormal results are displayed) Labs Reviewed - No data to display  EKG   Radiology No results found.  Procedures Procedures (including critical care time)  Medications Ordered in UC Medications - No data to display  Initial Impression / Assessment and Plan / UC Course  I have reviewed the triage vital signs and the nursing notes.  Pertinent labs & imaging results that were available during my care of the patient were reviewed by me and considered in my medical decision making (see chart for details).     This patient is a very pleasant 45 y.o. year old male presenting with cervical radiculitis. No red flag symptoms. Trial of prednisone and zanaflex as below, he is not a diabetic. ED return precautions discussed. Patient verbalizes understanding and agreement.    Final Clinical Impressions(s) / UC Diagnoses   Final diagnoses:  Cervical radiculopathy     Discharge Instructions      -Prednisone, 2 pills taken at the same time for 5 days in a row.  Try taking this earlier in the day as it can  give you energy. Avoid NSAIDs like ibuprofen and alleve while taking this medication as they can increase your risk of stomach upset and even GI bleeding when in combination with a  steroid. You can continue tylenol (acetaminophen) up to 1000mg  3x daily. -Start the muscle relaxer-Zanaflex (tizanidine), up to 3 times daily for muscle spasms and pain.  This can make you drowsy, so take at bedtime or when you do not need to drive or operate machinery. -Heat/ice for additional relief -Limit heavy lifting and overuse while pain persists. Work note provided.     ED Prescriptions     Medication Sig Dispense Auth. Provider   predniSONE (DELTASONE) 20 MG tablet Take 2 tablets (40 mg total) by mouth daily for 5 days. 10 tablet , PA-C   tiZANidine (ZANAFLEX) 2 MG tablet Take 1 tablet (2 mg total) by mouth every 6 (six) hours as needed for muscle spasms. 21 tablet Rhys Martini, PA-C      PDMP not reviewed this encounter.   Rhys Martini, PA-C 05/25/21 1250

## 2021-05-25 NOTE — Discharge Instructions (Addendum)
-  Prednisone, 2 pills taken at the same time for 5 days in a row.  Try taking this earlier in the day as it can give you energy. Avoid NSAIDs like ibuprofen and alleve while taking this medication as they can increase your risk of stomach upset and even GI bleeding when in combination with a steroid. You can continue tylenol (acetaminophen) up to 1000mg  3x daily. -Start the muscle relaxer-Zanaflex (tizanidine), up to 3 times daily for muscle spasms and pain.  This can make you drowsy, so take at bedtime or when you do not need to drive or operate machinery. -Heat/ice for additional relief -Limit heavy lifting and overuse while pain persists. Work note provided.

## 2021-08-14 ENCOUNTER — Encounter (HOSPITAL_COMMUNITY): Payer: Self-pay

## 2021-08-14 ENCOUNTER — Other Ambulatory Visit: Payer: Self-pay

## 2021-08-14 ENCOUNTER — Ambulatory Visit (HOSPITAL_COMMUNITY)
Admission: EM | Admit: 2021-08-14 | Discharge: 2021-08-14 | Disposition: A | Discharge status: Home / Self Care | Payer: 59 | Attending: Internal Medicine | Admitting: Internal Medicine

## 2021-08-14 DIAGNOSIS — G5601 Carpal tunnel syndrome, right upper limb: Secondary | ICD-10-CM | POA: Diagnosis not present

## 2021-08-14 MED ORDER — PREDNISONE 10 MG (21) PO TBPK: ORAL_TABLET | Freq: Every day | ORAL | 0 refills | Status: DC

## 2021-08-14 NOTE — Discharge Instructions (Addendum)
Please use wrist splint during sleep and when you are not at work Take medications as prescribed Gentle range of motion exercises Symptoms persist please go to orthopedic surgery (with information on the discharge papers).

## 2021-08-14 NOTE — ED Triage Notes (Signed)
Pt reports numbness and tingling fingers right hand x 2 months. State sis worse when la down.

## 2021-08-15 NOTE — ED Provider Notes (Signed)
[facility]    CSN: [id number] Arrival date & time: 08/14/21  1610      History   Chief Complaint Chief Complaint  Patient presents with   Tingling   Numbness    HPI Steve Jordan is a 45 y.o. male with no past medical history comes to urgent care with a 1 month history of numbness and tingling in the thumb, index and middle fingers of the right hand.  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  He works 9-hour days-5 days a week.  No wrist pain.  Patient continues to have a good grip.  No rash on the fingers.  No symptoms in the left hand.  Patient was seen a couple of months ago for similar reasons.  Prednisone was prescribed.  Patient had transient improvement in symptoms but has worsened.  HPI  Past Medical History:  Diagnosis Date   Hypertension     Patient Active Problem List   Diagnosis Date Noted   Severe episode of recurrent major depressive disorder, without psychotic features (HCC) 04/29/2020    History reviewed. No pertinent surgical history.     Home Medications    Prior to Admission medications   Medication Sig Start Date End Date Taking? Authorizing Provider  predniSONE (STERAPRED UNI-PAK 21 TAB) 10 MG (21) TBPK tablet Take by mouth daily. Take 6 tabs by mouth daily  for 2 days, then 5 tabs for 2 days, then 4 tabs for 2 days, then 3 tabs for 2 days, 2 tabs for 2 days, then 1 tab by mouth daily for 2 days 08/14/21  Yes Murvin Gift, Britta Mccreedy, MD  OLANZapine (ZYPREXA) 5 MG tablet Take 1 tablet (5 mg total) by mouth at bedtime. 04/29/20 05/29/20  Shanna Cisco, NP    Family History History reviewed. No pertinent family history.  Social History Social History   Tobacco Use   Smoking status: Every Day    Packs/day: 0.50    Types: Cigarettes   Smokeless tobacco: Never  Substance Use Topics   Alcohol use: Yes   Drug use: Never     Allergies   Patient has no known  allergies.   Review of Systems Review of Systems  Musculoskeletal:  Positive for arthralgias. Negative for joint swelling, myalgias, neck pain and neck stiffness.  Neurological:  Negative for headaches.    Physical Exam Triage Vital Signs ED Triage Vitals  Enc Vitals Group     BP 08/14/21 1728 (!) 146/95     Pulse Rate 08/14/21 1728 80     Resp 08/14/21 1728 18     Temp 08/14/21 1728 98.1 F (36.7 C)     Temp Source 08/14/21 1728 Oral     SpO2 08/14/21 1728 97 %     Weight --      Height --      Head Circumference --      Peak Flow --      Pain Score 08/14/21 1726 10     Pain Loc --      Pain Edu? --      Excl. in GC? --    No data found.  Updated Vital Signs BP (!) 146/95 (BP Location: Left Arm) Comment: Pt has not taken his meds iin 1 year, as he is looking for a PCP.  Pulse 80   Temp 98.1 F (36.7 C) (Oral)   Resp 18   SpO2 97%   Visual  Acuity Right Eye Distance:   Left Eye Distance:   Bilateral Distance:    Right Eye Near:   Left Eye Near:    Bilateral Near:     Physical Exam Constitutional:      Appearance: Normal appearance.  Cardiovascular:     Rate and Rhythm: Normal rate and regular rhythm.  Musculoskeletal:        General: No swelling, tenderness or signs of injury. Normal range of motion.  Skin:    Capillary Refill: Capillary refill takes less than 2 seconds.  Neurological:     General: No focal deficit present.     Mental Status: He is alert and oriented to person, place, and time.     Sensory: No sensory deficit.     Motor: No weakness.     Coordination: Coordination normal.     UC Treatments / Results  Labs (all labs ordered are listed, but only abnormal results are displayed) Labs Reviewed - No data to display  EKG   Radiology No results found.  Procedures Procedures (including critical care time)  Medications Ordered in UC Medications - No data to display  Initial Impression / Assessment and Plan / UC Course  I have  reviewed the triage vital signs and the nursing notes.  Pertinent labs & imaging results that were available during my care of the patient were reviewed by me and considered in my medical decision making (see chart for details).     Carpal Tunnel Syndrome: Tapering dose prednisone Wrist brace Gentle range of motion Return to urgent care if symptoms persist If symptoms persist you may benefit from orthopedic surgery evaluation. Final Clinical Impressions(s) / UC Diagnoses   Final diagnoses:  Carpal tunnel syndrome of right wrist     Discharge Instructions      Please use wrist splint during sleep and when you are not at work Take medications as prescribed Gentle range of motion exercises Symptoms persist please go to orthopedic surgery (with information on the discharge papers).   ED Prescriptions     Medication Sig Dispense Auth. Provider   predniSONE (STERAPRED UNI-PAK 21 TAB) 10 MG (21) TBPK tablet Take by mouth daily. Take 6 tabs by mouth daily  for 2 days, then 5 tabs for 2 days, then 4 tabs for 2 days, then 3 tabs for 2 days, 2 tabs for 2 days, then 1 tab by mouth daily for 2 days 42 tablet Keshanna Riso, Britta Mccreedy, MD      PDMP not reviewed this encounter.   Merrilee Jansky, MD 08/15/21 [phone]

## 2022-08-27 ENCOUNTER — Ambulatory Visit (INDEPENDENT_AMBULATORY_CARE_PROVIDER_SITE_OTHER): Payer: 59 | Admitting: Behavioral Health

## 2022-08-27 DIAGNOSIS — F4329 Adjustment disorder with other symptoms: Secondary | ICD-10-CM | POA: Diagnosis not present

## 2022-08-27 DIAGNOSIS — F418 Other specified anxiety disorders: Secondary | ICD-10-CM | POA: Diagnosis not present

## 2022-08-27 NOTE — Progress Notes (Signed)
                Steve Jordan L Tyleah Loh, LMFT 
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
## 2022-09-17 ENCOUNTER — Ambulatory Visit: Payer: 59 | Admitting: Behavioral Health

## 2022-09-17 NOTE — Progress Notes (Unsigned)
                Fate Galanti L Fey Coghill, LMFT 

## 2022-09-25 ENCOUNTER — Emergency Department (HOSPITAL_COMMUNITY)
Admission: EM | Admit: 2022-09-25 | Discharge: 2022-09-25 | Disposition: A | Discharge status: Home / Self Care | Payer: 59 | Attending: Emergency Medicine | Admitting: Emergency Medicine

## 2022-09-25 ENCOUNTER — Other Ambulatory Visit: Payer: Self-pay

## 2022-09-25 ENCOUNTER — Encounter (HOSPITAL_COMMUNITY): Payer: Self-pay

## 2022-09-25 DIAGNOSIS — M5431 Sciatica, right side: Secondary | ICD-10-CM | POA: Diagnosis not present

## 2022-09-25 DIAGNOSIS — R030 Elevated blood-pressure reading, without diagnosis of hypertension: Secondary | ICD-10-CM

## 2022-09-25 DIAGNOSIS — R200 Anesthesia of skin: Secondary | ICD-10-CM | POA: Diagnosis present

## 2022-09-25 DIAGNOSIS — M79604 Pain in right leg: Secondary | ICD-10-CM

## 2022-09-25 LAB — CBG MONITORING, ED: Glucose-Capillary: 104 mg/dL — ABNORMAL HIGH (ref 70–99)

## 2022-09-25 MED ORDER — PREDNISONE 10 MG PO TABS: 40.0000 mg | ORAL_TABLET | Freq: Every day | ORAL | 0 refills | Status: AC
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
## 2022-10-07 ENCOUNTER — Ambulatory Visit (INDEPENDENT_AMBULATORY_CARE_PROVIDER_SITE_OTHER): Payer: 59 | Admitting: Behavioral Health

## 2022-10-07 DIAGNOSIS — F4329 Adjustment disorder with other symptoms: Secondary | ICD-10-CM

## 2022-10-07 DIAGNOSIS — F418 Other specified anxiety disorders: Secondary | ICD-10-CM | POA: Diagnosis not present

## 2022-10-07 NOTE — Progress Notes (Signed)
                Uthman Mroczkowski L Miho Monda, LMFT 

## 2022-10-07 NOTE — Progress Notes (Addendum)
[facility] Counselor/Therapist Progress Note  Patient ID: Steve Jordan, MRN: [id number],    Date: 10/07/2022  Time Spent: 30 min Caregility video; Pt is home in private & Provider @ [facility] Office  Treatment Type: Individual Therapy  Reported Symptoms: Elevated anx/dep due to Disability Hearing on Tue he is unsure went well.   Mental Status Exam: Appearance:  Casual     Behavior: Appropriate and Sharing  Motor: Normal  Speech/Language:  Clear and Coherent and Normal Rate  Affect: Appropriate  Mood: normal  Thought process: normal  Thought content:   WNL  Sensory/Perceptual disturbances:   WNL  Orientation: oriented to person, place, and time/date  Attention: Good  Concentration: Good  Memory: WNL  Fund of knowledge:  Good  Insight:   Good  Judgment:  Good  Impulse Control: Good   Risk Assessment: Danger to Self:  No Self-injurious Behavior: No Danger to Others: No Duty to Warn:no Physical Aggression / Violence:No  Access to Firearms a concern: No  Gang Involvement:No   Subjective: Pt is concerned for his Disability Appl & starting his medications again w/a PCP. Suggested the [facility] for him to estb as a NuPt. Pt sts he will f/u with [facility].    Interventions: Solution-Oriented/Positive Psychology  Diagnosis:Depression with anxiety  Grief reaction with prolonged bereavement  Plan: Eduin is having anx/dep due to the death of his Son by electrocution. His grief is prolonged & deep. He does not want to discuss his Son's death daily. He asked we not discuss it today. At our next visit he will agree to speak briefly about his Son.  Target Date: 11/06/2022  Progress: 4  Frequency: Twice monthly through the Holidays if possible  Modality: Pratt Bress is down about his Disability Hearing call on Tue of this week. He does not feel it went well. He also cannot secure an appt @ [facility] to estb w/a PCP. He will call & ask for [facility].  Target Date:  10/28/2022  Progress: 3  Frequency: Twice monthly or as able  Modality: Claretta Fraise, LMFT

## 2022-10-27 ENCOUNTER — Ambulatory Visit: Payer: 59 | Admitting: Behavioral Health

## 2022-10-27 NOTE — Progress Notes (Unsigned)
                Glynnis Gavel L Mahum Betten, LMFT 

## 2024-12-27 ENCOUNTER — Emergency Department (HOSPITAL_COMMUNITY): Payer: PRIVATE HEALTH INSURANCE

## 2024-12-27 ENCOUNTER — Other Ambulatory Visit: Payer: Self-pay

## 2024-12-27 ENCOUNTER — Emergency Department (HOSPITAL_COMMUNITY)
Admission: EM | Admit: 2024-12-27 | Discharge: 2024-12-27 | Disposition: A | Discharge status: Home / Self Care | Payer: PRIVATE HEALTH INSURANCE | Attending: Emergency Medicine | Admitting: Emergency Medicine

## 2024-12-27 DIAGNOSIS — I10 Essential (primary) hypertension: Secondary | ICD-10-CM | POA: Insufficient documentation

## 2024-12-27 DIAGNOSIS — R202 Paresthesia of skin: Secondary | ICD-10-CM | POA: Insufficient documentation

## 2024-12-27 LAB — BASIC METABOLIC PANEL WITH GFR
Anion gap: 13 (ref 5–15)
BUN: 12 mg/dL (ref 6–20)
CO2: 19 mmol/L — ABNORMAL LOW (ref 22–32)
Calcium: 9.2 mg/dL (ref 8.9–10.3)
Chloride: 105 mmol/L (ref 98–111)
Creatinine, Ser: 0.87 mg/dL (ref 0.61–1.24)
GFR, Estimated: >60 mL/min
Glucose, Bld: 106 mg/dL — ABNORMAL HIGH (ref 70–99)
Potassium: 4.2 mmol/L (ref 3.5–5.1)
Sodium: 137 mmol/L (ref 135–145)

## 2024-12-27 LAB — CBC WITH DIFFERENTIAL/PLATELET
Abs Immature Granulocytes: 0.02 10*3/uL (ref 0.00–0.07)
Basophils Absolute: 0.1 10*3/uL (ref 0.0–0.1)
Basophils Relative: 1 %
Eosinophils Absolute: 0.1 10*3/uL (ref 0.0–0.5)
Eosinophils Relative: 1 %
HCT: 46.4 % (ref 39.0–52.0)
Hemoglobin: 16.5 g/dL (ref 13.0–17.0)
Immature Granulocytes: 0 %
Lymphocytes Relative: 22 %
Lymphs Abs: 1.4 10*3/uL (ref 0.7–4.0)
MCH: 30.6 pg (ref 26.0–34.0)
MCHC: 35.6 g/dL (ref 30.0–36.0)
MCV: 86.1 fL (ref 80.0–100.0)
Monocytes Absolute: 0.7 10*3/uL (ref 0.1–1.0)
Monocytes Relative: 12 %
Neutro Abs: 3.9 10*3/uL (ref 1.7–7.7)
Neutrophils Relative %: 64 %
Platelets: 193 10*3/uL (ref 150–400)
RBC: 5.39 MIL/uL (ref 4.22–5.81)
RDW: 12.7 % (ref 11.5–15.5)
WBC: 6.1 10*3/uL (ref 4.0–10.5)
nRBC: 0 % (ref 0.0–0.2)

## 2024-12-27 LAB — MAGNESIUM: Magnesium: 2.2 mg/dL (ref 1.7–2.4)

## 2024-12-27 MED ORDER — GABAPENTIN 100 MG PO CAPS: 100.0000 mg | ORAL_CAPSULE | Freq: Two times a day (BID) | ORAL | 0 refills | Status: AC

## 2024-12-27 MED ORDER — GABAPENTIN 100 MG PO CAPS
100.0000 mg | ORAL_CAPSULE | Freq: Once | ORAL | Status: AC
  Administered 2024-12-27: 100 mg via ORAL
  Filled 2024-12-27: qty 1
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
## 2024-12-27 NOTE — ED Notes (Signed)
 Patient transported to X-ray
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
